# Patient Record
Sex: Male | Born: 1975 | Race: White | Hispanic: No | Marital: Married | Smoking: Never smoker
Health system: Southern US, Community
[De-identification: ages and names within clinical notes are randomized; demographics above are authoritative.]

## PROBLEM LIST (undated history)

## (undated) HISTORY — PX: TONSILLECTOMY: SUR1361

---

## 1998-12-15 ENCOUNTER — Emergency Department (HOSPITAL_COMMUNITY): Admission: EM | Admit: 1998-12-15 | Discharge: 1998-12-15 | Payer: Self-pay

## 1999-12-13 ENCOUNTER — Emergency Department (HOSPITAL_COMMUNITY): Admission: EM | Admit: 1999-12-13 | Discharge: 1999-12-13 | Payer: Self-pay | Admitting: Emergency Medicine

## 2000-05-04 ENCOUNTER — Emergency Department (HOSPITAL_COMMUNITY): Admission: EM | Admit: 2000-05-04 | Discharge: 2000-05-04 | Payer: Self-pay | Admitting: Internal Medicine

## 2000-12-26 ENCOUNTER — Emergency Department (HOSPITAL_COMMUNITY): Admission: EM | Admit: 2000-12-26 | Discharge: 2000-12-26 | Payer: Self-pay | Admitting: *Deleted

## 2007-12-09 ENCOUNTER — Emergency Department (HOSPITAL_COMMUNITY): Admission: EM | Admit: 2007-12-09 | Discharge: 2007-12-09 | Payer: Self-pay | Admitting: Emergency Medicine

## 2011-03-24 LAB — POCT I-STAT, CHEM 8
BUN: 11
Calcium, Ion: 1.13
Chloride: 108
Creatinine, Ser: 0.7
Glucose, Bld: 105 — ABNORMAL HIGH
HCT: 40
Hemoglobin: 13.6
Potassium: 4.4
Sodium: 140
TCO2: 24

## 2011-06-26 ENCOUNTER — Emergency Department (HOSPITAL_BASED_OUTPATIENT_CLINIC_OR_DEPARTMENT_OTHER)
Admission: EM | Admit: 2011-06-26 | Discharge: 2011-06-26 | Disposition: A | Discharge status: Home / Self Care | Attending: Emergency Medicine | Admitting: Emergency Medicine

## 2011-06-26 DIAGNOSIS — J069 Acute upper respiratory infection, unspecified: Secondary | ICD-10-CM

## 2011-06-26 NOTE — ED Notes (Signed)
Head,chest congestion x "few weeks"

## 2011-06-26 NOTE — ED Provider Notes (Signed)
History     CSN: [id number]  Arrival date & time 06/26/11  1105   First MD Initiated Contact with Patient 06/26/11 1134      Chief Complaint  Patient presents with  . URI   patient describes dry cough, nasal congestion, runny nose for the past few weeks. He's had no chest pain. No fever. No nausea or vomiting. He is here with for the family members that are sick with upper respiratory symptoms. Patient is in no distress.  (Consider location/radiation/quality/duration/timing/severity/associated sxs/prior treatment) HPI  History reviewed. No pertinent past medical history.  Past Surgical History  Procedure Date  . Tonsillectomy     No family history on file.  History  Substance Use Topics  . Smoking status: Never Smoker   . Smokeless tobacco: Not on file  . Alcohol Use: No      Review of Systems  All other systems reviewed and are negative.    Allergies  Review of patient's allergies indicates no known allergies.  Home Medications  No current outpatient prescriptions on file.  BP 128/58  Pulse 63  Temp(Src) 98.1 F (36.7 C) (Oral)  Resp 16  Ht 5\' 10"  (1.778 m)  Wt 195 lb (88.451 kg)  BMI 27.98 kg/m2  SpO2 98%  Physical Exam  Nursing note and vitals reviewed. Constitutional: He is oriented to person, place, and time. He appears well-developed and well-nourished.  HENT:  Head: Normocephalic and atraumatic.  Mouth/Throat: Oropharynx is clear and moist.  Eyes: Conjunctivae and EOM are normal. Pupils are equal, round, and reactive to light.  Neck: Neck supple.  Cardiovascular: Normal rate and regular rhythm.  Exam reveals no gallop and no friction rub.   No murmur heard. Pulmonary/Chest: Effort normal and breath sounds normal. No respiratory distress. He has no wheezes. He has no rales. He exhibits no tenderness.  Abdominal: Soft. Bowel sounds are normal. He exhibits no distension. There is no tenderness. There is no rebound and no guarding.    Musculoskeletal: Normal range of motion.  Neurological: He is alert and oriented to person, place, and time. No cranial nerve deficit. Coordination normal.  Skin: Skin is warm and dry. No rash noted.  Psychiatric: He has a normal mood and affect.    ED Course  Procedures (including critical care time)  Labs Reviewed - No data to display No results found.   No diagnosis found.    MDM  Pt is seen and examined;  Initial history and physical completed.  Will follow.          Clayton Jarmon A. Patrica Duel, MD 06/26/11 1147

## 2013-07-12 ENCOUNTER — Encounter (HOSPITAL_BASED_OUTPATIENT_CLINIC_OR_DEPARTMENT_OTHER): Payer: Self-pay | Admitting: Emergency Medicine

## 2013-07-12 ENCOUNTER — Emergency Department (HOSPITAL_BASED_OUTPATIENT_CLINIC_OR_DEPARTMENT_OTHER)
Admission: EM | Admit: 2013-07-12 | Discharge: 2013-07-12 | Disposition: A | Discharge status: Home / Self Care | Attending: Emergency Medicine | Admitting: Emergency Medicine

## 2013-07-12 DIAGNOSIS — X500XXA Overexertion from strenuous movement or load, initial encounter: Secondary | ICD-10-CM | POA: Insufficient documentation

## 2013-07-12 DIAGNOSIS — M7750 Other enthesopathy of unspecified foot: Secondary | ICD-10-CM

## 2013-07-12 DIAGNOSIS — M659 Synovitis and tenosynovitis, unspecified: Secondary | ICD-10-CM | POA: Insufficient documentation

## 2013-07-12 DIAGNOSIS — M65979 Unspecified synovitis and tenosynovitis, unspecified ankle and foot: Secondary | ICD-10-CM | POA: Insufficient documentation

## 2013-07-12 DIAGNOSIS — Y939 Activity, unspecified: Secondary | ICD-10-CM | POA: Insufficient documentation

## 2013-07-12 DIAGNOSIS — Y92838 Other recreation area as the place of occurrence of the external cause: Secondary | ICD-10-CM

## 2013-07-12 DIAGNOSIS — Y9239 Other specified sports and athletic area as the place of occurrence of the external cause: Secondary | ICD-10-CM | POA: Insufficient documentation

## 2013-07-12 MED ORDER — HYDROCODONE-ACETAMINOPHEN 5-325 MG PO TABS: 2.0000 | ORAL_TABLET | ORAL | Status: DC | PRN

## 2013-07-12 NOTE — Discharge Instructions (Signed)
Tendinitis °Tendinitis is swelling and inflammation of the tendons. Tendons are band-like tissues that connect muscle to bone. Tendinitis commonly occurs in the:  °· Shoulders (rotator cuff). °· Heels (Achilles tendon). °· Elbows (triceps tendon). °CAUSES °Tendinitis is usually caused by overusing the tendon, muscles, and joints involved. When the tissue surrounding a tendon (synovium) becomes inflamed, it is called tenosynovitis. Tendinitis commonly develops in people whose jobs require repetitive motions. °SYMPTOMS °· Pain. °· Tenderness. °· Mild swelling. °DIAGNOSIS °Tendinitis is usually diagnosed by physical exam. Your caregiver may also order X-rays or other imaging tests. °TREATMENT °Your caregiver may recommend certain medicines or exercises for your treatment. °HOME CARE INSTRUCTIONS  °· Use a sling or splint for as long as directed by your caregiver until the pain decreases. °· Put ice on the injured area. °· Put ice in a plastic bag. °· Place a towel between your skin and the bag. °· Leave the ice on for 15-20 minutes, 03-04 times a day. °· Avoid using the limb while the tendon is painful. Perform gentle range of motion exercises only as directed by your caregiver. Stop exercises if pain or discomfort increase, unless directed otherwise by your caregiver. °· Only take over-the-counter or prescription medicines for pain, discomfort, or fever as directed by your caregiver. °SEEK MEDICAL CARE IF:  °· Your pain and swelling increase. °· You develop new, unexplained symptoms, especially increased numbness in the hands. °MAKE SURE YOU:  °· Understand these instructions. °· Will watch your condition. °· Will get help right away if you are not doing well or get worse. °Document Released: 06/10/2000 Document Revised: 09/05/2011 Document Reviewed: 08/30/2010 °[employer]® Patient Information ©2014 [employer]. ° °

## 2013-07-12 NOTE — ED Provider Notes (Signed)
CSN: [id number]     Arrival date & time 07/12/13  1726 History   This chart was scribed for Nelia Shiobert L Derry Kassel, MD by Blanchard KelchNicole Curnes, ED Scribe. The patient was seen in room MH04/MH04. Patient's care was started at 8:18 PM.    Chief Complaint  Patient presents with  . Leg Pain    Patient is a 38 y.o. male presenting with leg pain. The history is provided by the patient. No language interpreter was used.  Leg Pain   HPI Comments: Samuel Davies is a 38 y.o. male who presents to the Emergency Department complaining of constant, moderate right ankle pain that began yesterday. He has a decreased ROM of the ankle secondary to pain. He also reports associated warmth to the area. He has been taking Ibuprofen, 800 mg every eight hours, starting yesterday afternoon. He denies any acute injury that he knows of other than walking on it much more this week due to starting a new job. He also twisted the ankle today at the gym, but this was after the original onset of the pain. He has a history of plantar fasciatus in his right foot. He denies any other pertinent history, including arthritis or gout.    History reviewed. No pertinent past medical history. Past Surgical History  Procedure Laterality Date  . Tonsillectomy     No family history on file. History  Substance Use Topics  . Smoking status: Never Smoker   . Smokeless tobacco: Not on file  . Alcohol Use: No    Review of Systems A complete 10 system review of systems was obtained and all systems are negative except as noted in the HPI and PMH.    Allergies  Review of patient's allergies indicates no known allergies.  Home Medications   Current Outpatient Rx  Name  Route  Sig  Dispense  Refill  . HYDROcodone-acetaminophen (NORCO/VICODIN) 5-325 MG per tablet   Oral   Take 2 tablets by mouth every 4 (four) hours as needed.   6 tablet   0     Triage Vitals: BP 127/76  Pulse 66  Temp(Src) 98.3 F (36.8 C) (Oral)  Resp 20  Ht 5\' 10"   (1.778 m)  Wt 240 lb (108.863 kg)  BMI 34.44 kg/m2  SpO2 100%  Physical Exam  Nursing note and vitals reviewed. Constitutional: He is oriented to person, place, and time. He appears well-developed and well-nourished. No distress.  HENT:  Head: Normocephalic and atraumatic.  Eyes: Pupils are equal, round, and reactive to light.  Neck: Normal range of motion.  Cardiovascular: Normal rate and intact distal pulses.   Pulmonary/Chest: No respiratory distress.  Abdominal: Normal appearance. He exhibits no distension.  Musculoskeletal:       Right ankle: He exhibits decreased range of motion. He exhibits no swelling and normal pulse. Achilles tendon normal.       Feet:  No medial or lateral boney tenderness  Neurological: He is alert and oriented to person, place, and time. No cranial nerve deficit.  Skin: Skin is warm and dry. No rash noted.  Psychiatric: He has a normal mood and affect. His behavior is normal.    ED Course  Procedures (including critical care time)  DIAGNOSTIC STUDIES: Oxygen Saturation is 100% on room air, normal by my interpretation.    COORDINATION OF CARE: 8:22 PM -Clinical suspicion of tenosynovitis. Recommend OTC NSAIDs and avoiding overuse. Patient verbalizes understanding and agrees with treatment plan.  Labs Review Labs Reviewed - No  data to display Imaging Review No results found.  EKG Interpretation   None       MDM   1. Tendonitis of ankle    I personally performed the services described in this documentation, which was scribed in my presence. The recorded information has been reviewed and considered.   Nelia Shi, MD 07/18/13 2009

## 2013-07-12 NOTE — ED Notes (Addendum)
Pain and swelling to his right lower leg. Second injury when he twisted his right ankle at the gym. He would like to have a rash on his arms and legs checked while he is here.

## 2013-07-12 NOTE — ED Notes (Signed)
Pt reports right ankle pain since yesterday, unsure exact injury. Redness noted to ankle. Twisted at gym today. No obvious swelilng/deformity.

## 2016-06-29 DIAGNOSIS — J45909 Unspecified asthma, uncomplicated: Secondary | ICD-10-CM | POA: Insufficient documentation

## 2017-01-17 ENCOUNTER — Encounter: Payer: Self-pay | Admitting: Podiatry

## 2017-01-17 ENCOUNTER — Ambulatory Visit (INDEPENDENT_AMBULATORY_CARE_PROVIDER_SITE_OTHER): Payer: BLUE CROSS/BLUE SHIELD | Admitting: Podiatry

## 2017-01-17 DIAGNOSIS — L600 Ingrowing nail: Secondary | ICD-10-CM

## 2017-01-17 DIAGNOSIS — B351 Tinea unguium: Secondary | ICD-10-CM

## 2017-01-17 MED ORDER — CEPHALEXIN 500 MG PO CAPS: 500.0000 mg | ORAL_CAPSULE | Freq: Three times a day (TID) | ORAL | 0 refills | Status: DC

## 2017-01-17 NOTE — Progress Notes (Signed)
Subjective:    Patient ID: Samuel Davies, male   DOB: 41 y.o.   MRN: [id number]   HPI Mr. Samuel Davies Presents the office today for concerns of a chronic ingrown toenail to left big toe. He states that this has been ongoing for the last 5 years but over the last couple months has been worsening. He states that he will try cut the nail out himself and is better for a couple months and stretch come back. He states that his been red and swollen around the toenail for over one month and has been looking somewhat better but does continue. Denies any drainage or pus. He did previously have the nail removed approximately 20 years ago on the corners. Has no other concerns today.   Review of Systems  All other systems reviewed and are negative.       Objective:  Physical Exam General: AAO x3, NAD  Dermatological: There is incurvation present both the medial and lateral aspects of the left hallux toenail with localized edema and erythema along the nail borders. There is no ascending saline as there is no drainage or pus expressed. There is tenderness on the nail borders. The left hallux toenail does appear to have yellow to brown discoloration an mildly thick. No open lesions or pre-ulcerative lesions of the foot otherwise.  Vascular: Dorsalis Pedis artery and Posterior Tibial artery pedal pulses are 2/4 bilateral with immedate capillary fill time.  There is no pain with calf compression, swelling, warmth, erythema.   Neruologic: Grossly intact via light touch bilateral. Vibratory intact via tuning fork bilateral. Protective threshold with Semmes Wienstein monofilament intact to all pedal sites bilateral.   Musculoskeletal: No gross boney pedal deformities bilateral. No pain, crepitus, or limitation noted with foot and ankle range of motion bilateral. Muscular strength 5/5 in all groups tested bilateral.  Gait: Unassisted, Nonantalgic.      Assessment:     41 year old male symptomatic ingrown toenail  left hallux medial/lateral nail borders    Plan:     -Treatment options discussed including all alternatives, risks, and complications -Etiology of symptoms were discussed -At this time, the patient is requesting partial nail removal with chemical matricectomy to the symptomatic portion of the nail. Risks and complications were discussed with the patient for which they understand and  verbally consent to the procedure. Under sterile conditions a total of 3 mL of a mixture of 2% lidocaine plain and 0.5% Marcaine plain was infiltrated in a hallux block fashion. Once anesthetized, the skin was prepped in sterile fashion. A tourniquet was then applied. Next the medial and lateral aspect of hallux nail border was then sharply excised making sure to remove the entire offending nail border. Once the nails were ensured to be removed area was debrided and the underlying skin was intact. There is no purulence identified in the procedure. Next phenol was then applied under standard conditions and copiously irrigated. Silvadene was applied. A dry sterile dressing was applied. After application of the dressing the tourniquet was removed and there is found to be an immediate capillary refill time to the digit. The patient tolerated the procedure well any complications. Post procedure instructions were discussed the patient for which he verbally understood. Follow-up in one week for nail check or sooner if any problems are to arise. Discussed signs/symptoms of infection and directed to call the office immediately should any occur or go directly to the emergency room. In the meantime, encouraged to call the office with any questions,  concerns, changes symptoms. -Keflex -Nail sent for culture  Ovid Curd, DPM

## 2017-01-17 NOTE — Progress Notes (Signed)
   Subjective:    Patient ID: Samuel Davies, male    DOB: May 31, 1976, 41 y.o.   MRN: [id number]  HPI  I have an ingrown toenail on my left big toe and has been going on for about 1 month and is sore and tender and does drain and had it removed about 20 years ago and has come back and digs at it   Review of Systems  All other systems reviewed and are negative.      Objective:   Physical Exam        Assessment & Plan:

## 2017-01-17 NOTE — Addendum Note (Signed)
Addended by: Hadley PenOX, Elpidio Thielen R on: 01/17/2017 03:04 PM   Modules accepted: Orders

## 2017-01-17 NOTE — Patient Instructions (Signed)

## 2017-01-18 ENCOUNTER — Telehealth: Payer: Self-pay | Admitting: Podiatry

## 2017-01-18 ENCOUNTER — Telehealth: Payer: Self-pay | Admitting: *Deleted

## 2017-01-18 MED ORDER — DOXYCYCLINE HYCLATE 100 MG PO CAPS: 100.0000 mg | ORAL_CAPSULE | Freq: Two times a day (BID) | ORAL | 0 refills | Status: DC

## 2017-01-18 NOTE — Telephone Encounter (Signed)
Yes I'm calling because I had a procedure done yesterday and was not thinking when the antibiotic was prescribed that I'm allergic to it. I took it home to compare it and it is one I'm allergic to. I did not take it but I wanted to know if the doctor could send a new Rx to the [facility].

## 2017-01-18 NOTE — Telephone Encounter (Signed)
Thank you for changing that.

## 2017-01-18 NOTE — Telephone Encounter (Signed)
This patient called to tell me that he is allergic to cephalexin.  He was prescribed cephalexin by Dr. Ardelle AntonWagoner .  He called to say this medicine put him in the hospital for a week.   He says he was diagnosed with an allergic reaction.  I told him to stop taking cephalexin and I will call him another medication [facility]   Helane GuntherGregory Lynsey Ange DPM

## 2017-01-18 NOTE — Telephone Encounter (Signed)
Dr. Stacie AcresMayer states pt called states had ingrown [place] [facility] [place], was prescribed cephalexin. Pt states he forgot to tell Dr. Ardelle AntonWagoner he had a reaction to the Cephalexin, that hospitalized him for a week. Dr. Stacie AcresMayer ordered Doxycycline 100mg  #20 one capsule bid. I informed pt of the orders.

## 2017-01-24 ENCOUNTER — Ambulatory Visit (INDEPENDENT_AMBULATORY_CARE_PROVIDER_SITE_OTHER): Payer: BLUE CROSS/BLUE SHIELD | Admitting: Podiatry

## 2017-01-24 ENCOUNTER — Encounter: Payer: Self-pay | Admitting: Podiatry

## 2017-01-24 DIAGNOSIS — Z9889 Other specified postprocedural states: Secondary | ICD-10-CM

## 2017-01-24 DIAGNOSIS — L600 Ingrowing nail: Secondary | ICD-10-CM

## 2017-01-24 NOTE — Patient Instructions (Signed)

## 2017-01-24 NOTE — Progress Notes (Signed)
Subjective: Samuel Davies is a 41 y.o.  male returns to office today for follow up evaluation after having left Hallux medial and lateral permanent nail avulsion performed. Patient has been soaking using epsom salts and applying topical antibiotic covered with bandaid daily. Denies any pain, drainage, swelling. He is able to wear a regular shoe without any issues. Patient denies fevers, chills, nausea, vomiting. Denies any calf pain, chest pain, SOB.   Objective:  Vitals: Reviewed  General: Well developed, nourished, in no acute distress, alert and oriented x3   Dermatology: Skin is warm, dry and supple bilateral. Medial and latearl hallux nail border appears to be clean, dry, with mild granular tissue and surrounding scab. There is pain surrounding erythema but there is no drainage or pus there is no ascending cellulitis. No warmth. The remaining nails appear unremarkable at this time. There are no other lesions or other signs of infection present.  Neurovascular status: Intact. No lower extremity swelling; No pain with calf compression bilateral.  Musculoskeletal: Decreased tenderness to palpation of the medial and lateral hallux nail folds. Muscular strength within normal limits bilateral.   Assesement and Plan: S/p partial nail avulsion, doing well.   -Continue soaking in epsom salts twice a day followed by antibiotic ointment and a band-aid. Can leave uncovered at night. Continue this until completely healed.  -If the area has not healed in 2 weeks, call the office for follow-up appointment, or sooner if any problems arise.  -Monitor for any signs/symptoms of infection. Call the office immediately if any occur or go directly to the emergency room. Call with any questions/concerns.  Samuel Davies, DPM

## 2017-01-31 NOTE — Telephone Encounter (Addendum)
-----   Message from Vivi BarrackMatthew R Wagoner, DPM sent at 01/31/2017  1:39 PM EDT ----- Negative for fungus- please let him know. Thank you. Unable to leave a message the mail box is full.

## 2017-02-17 ENCOUNTER — Telehealth: Payer: Self-pay | Admitting: *Deleted

## 2017-02-17 NOTE — Telephone Encounter (Addendum)
-----   Message from Vivi Barrack, DPM sent at 01/31/2017  1:39 PM EDT ----- Negative for fungus- please let him know. Thank you.02/17/2017-Left message informing pt the specimen results were negative and no treatment was necessary.

## 2017-10-08 ENCOUNTER — Encounter: Payer: Self-pay | Admitting: Podiatry

## 2017-10-09 ENCOUNTER — Telehealth: Payer: Self-pay | Admitting: Podiatry

## 2017-10-09 NOTE — Telephone Encounter (Signed)
Left vm for pt to call office to schedule appt °

## 2017-10-10 ENCOUNTER — Encounter: Payer: Self-pay | Admitting: Podiatry

## 2017-10-10 ENCOUNTER — Ambulatory Visit (INDEPENDENT_AMBULATORY_CARE_PROVIDER_SITE_OTHER): Payer: BLUE CROSS/BLUE SHIELD | Admitting: Podiatry

## 2017-10-10 DIAGNOSIS — L600 Ingrowing nail: Secondary | ICD-10-CM

## 2017-10-10 MED ORDER — SULFAMETHOXAZOLE-TRIMETHOPRIM 800-160 MG PO TABS: 1.0000 | ORAL_TABLET | Freq: Two times a day (BID) | ORAL | 0 refills | Status: DC

## 2017-10-10 NOTE — Patient Instructions (Signed)

## 2017-10-11 NOTE — Progress Notes (Signed)
Subjective: Samuel Samuel Davies presents the office today for concerns of ingrown toenails to the right big toe on both nail corners.  The areas are painful with pressure in shoes.  Denies any redness or drainage or any pus.  He has no recent treatment.  The left foot is doing well and had no issues. Denies any systemic complaints such as fevers, chills, nausea, vomiting. No acute changes since last appointment, and no other complaints at this time.   Objective: AAO x3, NAD DP/PT pulses palpable bilaterally, CRT less than 3 seconds Incurvation present on both the medial aspect of the right hallux toenail with tenderness palpation.  There is localized edema and faint erythema but there is no drainage or pus.  No ascending cellulitis.  No malodor.  There is palpation of the nail corners of the right side.  Left side is doing well on there is no recurrence of ingrown toenail and no pain or signs of infection. No open lesions or pre-ulcerative lesions.  No pain with calf compression, swelling, warmth, erythema  Assessment: Right hallux ingrown toenail  Plan: -All treatment options discussed with the patient including all alternatives, risks, complications.  -At this time, the patient is requesting partial nail removal with chemical matricectomy to the symptomatic portion of the nail. Risks and complications were discussed with the patient for which they understand and written consent was obtained. Under sterile conditions a total of 3 mL of a mixture of 2% lidocaine plain and 0.5% Marcaine plain was infiltrated in a hallux block fashion. Once anesthetized, the skin was prepped in sterile fashion. A tourniquet was then applied. Next the medial and lateral aspect of hallux nail border was then sharply excised making sure to remove the entire offending nail border. Once the nails were ensured to be removed area was debrided and the underlying skin was intact. There is no purulence identified in the procedure. Next phenol  was then applied under standard conditions and copiously irrigated. Silvadene was applied. A dry sterile dressing was applied. After application of the dressing the tourniquet was removed and there is found to be an immediate capillary refill time to the digit. The patient tolerated the procedure well any complications. Post procedure instructions were discussed the patient for which he verbally understood. Follow-up in one week for nail check or sooner if any problems are to arise. Discussed signs/symptoms of infection and directed to call the office immediately should any occur or go directly to the emergency room. In the meantime, encouraged to call the office with any questions, concerns, changes symptoms. -Bactrim -Patient encouraged to call the office with any questions, concerns, change in symptoms.   Samuel BarrackMatthew R Samuel Davies Samuel Davies DPM

## 2017-10-27 ENCOUNTER — Ambulatory Visit: Payer: BLUE CROSS/BLUE SHIELD | Admitting: Podiatry

## 2018-02-20 DIAGNOSIS — G4709 Other insomnia: Secondary | ICD-10-CM | POA: Insufficient documentation

## 2018-02-20 DIAGNOSIS — F419 Anxiety disorder, unspecified: Secondary | ICD-10-CM | POA: Insufficient documentation

## 2018-04-16 DIAGNOSIS — J0120 Acute ethmoidal sinusitis, unspecified: Secondary | ICD-10-CM | POA: Insufficient documentation

## 2018-09-11 ENCOUNTER — Other Ambulatory Visit: Payer: Self-pay

## 2018-09-11 ENCOUNTER — Ambulatory Visit: Payer: BLUE CROSS/BLUE SHIELD | Admitting: Podiatry

## 2018-09-11 DIAGNOSIS — L600 Ingrowing nail: Secondary | ICD-10-CM | POA: Diagnosis not present

## 2018-09-11 MED ORDER — DOXYCYCLINE HYCLATE 100 MG PO TABS: 100.0000 mg | ORAL_TABLET | Freq: Two times a day (BID) | ORAL | 0 refills | Status: DC

## 2018-09-11 NOTE — Patient Instructions (Signed)

## 2018-09-12 NOTE — Progress Notes (Signed)
Subjective: 43 year old male presents the office today for concerns of recurrent ingrown toenail to left big toe, medial aspect which is been on for the last month.  He states that walking makes it worse.  Is been red and swollen on the corner but denies any drainage or pus at this time. Denies any systemic complaints such as fevers, chills, nausea, vomiting. No acute changes since last appointment, and no other complaints at this time.   Objective: AAO x3, NAD DP/PT pulses palpable bilaterally, CRT less than 3 seconds Incurvation present to the medial aspect the left hallux toenail with tenderness palpation.  There is localized edema and erythema but there is no drainage or pus there is no ascending cellulitis.  There is no fluctuation crepitation any malodor.  No open lesions or pre-ulcerative lesions.  No pain with calf compression, swelling, warmth, erythema  Assessment: Left medial hallux ingrown toenail  Plan: -All treatment options discussed with the patient including all alternatives, risks, complications.  -At this time, the patient is requesting partial nail removal with chemical matricectomy to the symptomatic portion of the nail. Risks and complications were discussed with the patient for which they understand and written consent was obtained. Under sterile conditions a total of 3 mL of a mixture of 2% lidocaine plain and 0.5% Marcaine plain was infiltrated in a hallux block fashion. Once anesthetized, the skin was prepped in sterile fashion. A tourniquet was then applied. Next the medial aspect of hallux nail border was then sharply excised making sure to remove the entire offending nail border. Once the nails were ensured to be removed area was debrided and the underlying skin was intact. There is no purulence identified in the procedure. Next phenol was then applied under standard conditions and copiously irrigated. Silvadene was applied. A dry sterile dressing was applied. After  application of the dressing the tourniquet was removed and there is found to be an immediate capillary refill time to the digit. The patient tolerated the procedure well any complications. Post procedure instructions were discussed the patient for which he verbally understood. Follow-up in one week for nail check or sooner if any problems are to arise. Discussed signs/symptoms of infection and directed to call the office immediately should any occur or go directly to the emergency room. In the meantime, encouraged to call the office with any questions, concerns, changes symptoms. -Doxycycline  -Patient encouraged to call the office with any questions, concerns, change in symptoms.   Vivi Barrack DPM

## 2018-09-18 ENCOUNTER — Other Ambulatory Visit: Payer: BLUE CROSS/BLUE SHIELD

## 2020-02-18 DIAGNOSIS — U071 COVID-19: Secondary | ICD-10-CM | POA: Insufficient documentation

## 2020-03-23 ENCOUNTER — Other Ambulatory Visit: Payer: Self-pay

## 2020-03-23 ENCOUNTER — Ambulatory Visit (INDEPENDENT_AMBULATORY_CARE_PROVIDER_SITE_OTHER): Payer: BC Managed Care – PPO | Admitting: Podiatry

## 2020-03-23 DIAGNOSIS — L600 Ingrowing nail: Secondary | ICD-10-CM | POA: Diagnosis not present

## 2020-03-23 MED ORDER — SULFAMETHOXAZOLE-TRIMETHOPRIM 800-160 MG PO TABS
1.0000 | ORAL_TABLET | Freq: Two times a day (BID) | ORAL | 0 refills | Status: DC
Start: 2020-03-23 — End: 2020-03-23

## 2020-03-23 MED ORDER — SULFAMETHOXAZOLE-TRIMETHOPRIM 800-160 MG PO TABS
1.0000 | ORAL_TABLET | Freq: Two times a day (BID) | ORAL | 0 refills | Status: DC
Start: 2020-03-23 — End: 2023-07-06

## 2020-03-23 NOTE — Patient Instructions (Signed)

## 2020-03-24 NOTE — Progress Notes (Signed)
Subjective: 44 year old male presents the office today for concerns of recurrent ingrown toenail left big toe but the medial side worse on the lateral.  He states the area is tender with pressure.  Denies any drainage or pus.  He is try to trim it himself without any success.  No other concerns.Denies any systemic complaints such as fevers, chills, nausea, vomiting. No acute changes since last appointment, and no other complaints at this time.   Objective: AAO x3, NAD DP/PT pulses palpable bilaterally, CRT less than 3 seconds Incurvation present to both medial lateral aspects of the left hallux toenail with localized edema and faint erythema.  No drainage or pus or ascending cellulitis.  The erythema is likely more from inflammation as opposed to infection.  There is no open lesions. No pain with calf compression, swelling, warmth, erythema  Assessment: 44 year old male ingrown toenail left hallux toenail  Plan: -All treatment options discussed with the patient including all alternatives, risks, complications.  -At this time, the patient is requesting partial nail removal with chemical matricectomy to the symptomatic portion of the nail. Risks and complications were discussed with the patient for which they understand and written consent was obtained. Under sterile conditions a total of 3 mL of a mixture of 2% lidocaine plain and 0.5% Marcaine plain was infiltrated in a hallux block fashion. Once anesthetized, the skin was prepped in sterile fashion. A tourniquet was then applied. Next the medial/lateral aspect of hallux nail border was then sharply excised making sure to remove the entire offending nail border. Once the nails were ensured to be removed area was debrided and the underlying skin was intact. There is no purulence identified in the procedure. Next phenol was then applied under standard conditions and copiously irrigated. Silvadene was applied. A dry sterile dressing was applied. After  application of the dressing the tourniquet was removed and there is found to be an immediate capillary refill time to the digit. The patient tolerated the procedure well any complications. Post procedure instructions were discussed the patient for which he verbally understood. Follow-up in one week for nail check or sooner if any problems are to arise. Discussed signs/symptoms of infection and directed to call the office immediately should any occur or go directly to the emergency room. In the meantime, encouraged to call the office with any questions, concerns, changes symptoms. -Patient encouraged to call the office with any questions, concerns, change in symptoms.   Vivi Barrack DPM

## 2020-04-13 ENCOUNTER — Ambulatory Visit: Payer: BC Managed Care – PPO | Admitting: Podiatry

## 2020-10-06 ENCOUNTER — Other Ambulatory Visit: Payer: Self-pay | Admitting: Physical Medicine & Rehabilitation

## 2020-10-06 DIAGNOSIS — M545 Low back pain, unspecified: Secondary | ICD-10-CM

## 2020-10-09 ENCOUNTER — Other Ambulatory Visit

## 2020-10-13 ENCOUNTER — Ambulatory Visit
Admission: RE | Admit: 2020-10-13 | Discharge: 2020-10-13 | Disposition: A | Discharge status: Home / Self Care | Source: Ambulatory Visit | Attending: Physical Medicine & Rehabilitation | Admitting: Physical Medicine & Rehabilitation

## 2020-10-13 ENCOUNTER — Other Ambulatory Visit: Payer: Self-pay

## 2020-10-13 DIAGNOSIS — M545 Low back pain, unspecified: Secondary | ICD-10-CM

## 2020-10-18 ENCOUNTER — Other Ambulatory Visit

## 2020-11-24 ENCOUNTER — Other Ambulatory Visit: Payer: Self-pay | Admitting: Sports Medicine

## 2020-11-24 ENCOUNTER — Ambulatory Visit (INDEPENDENT_AMBULATORY_CARE_PROVIDER_SITE_OTHER): Payer: BC Managed Care – PPO | Admitting: Sports Medicine

## 2020-11-24 ENCOUNTER — Other Ambulatory Visit: Payer: Self-pay

## 2020-11-24 ENCOUNTER — Ambulatory Visit (INDEPENDENT_AMBULATORY_CARE_PROVIDER_SITE_OTHER): Payer: BC Managed Care – PPO

## 2020-11-24 ENCOUNTER — Encounter: Payer: Self-pay | Admitting: Sports Medicine

## 2020-11-24 DIAGNOSIS — M79673 Pain in unspecified foot: Secondary | ICD-10-CM | POA: Diagnosis not present

## 2020-11-24 DIAGNOSIS — M722 Plantar fascial fibromatosis: Secondary | ICD-10-CM | POA: Diagnosis not present

## 2020-11-24 DIAGNOSIS — M7730 Calcaneal spur, unspecified foot: Secondary | ICD-10-CM | POA: Diagnosis not present

## 2020-11-24 DIAGNOSIS — M2141 Flat foot [pes planus] (acquired), right foot: Secondary | ICD-10-CM | POA: Diagnosis not present

## 2020-11-24 DIAGNOSIS — M2142 Flat foot [pes planus] (acquired), left foot: Secondary | ICD-10-CM

## 2020-11-24 DIAGNOSIS — G8929 Other chronic pain: Secondary | ICD-10-CM

## 2020-11-24 MED ORDER — PREDNISONE 10 MG (21) PO TBPK: ORAL_TABLET | ORAL | 0 refills | Status: DC

## 2020-11-24 MED ORDER — MELOXICAM 15 MG PO TABS: 15.0000 mg | ORAL_TABLET | Freq: Every day | ORAL | 0 refills | Status: DC

## 2020-11-24 NOTE — Progress Notes (Signed)
Subjective: Samuel Davies is a 45 y.o. male patient presents to office with complaint of moderate heel pain on the left as well as pain on the right but the left hurts worse with some numbness states that he has had pain for years recently got worse with increased activity of walking and standing on concrete floors of 12 to 14 hours pain is 5 out of 10 has tried stretching over-the-counter topical creams or rubs but still has pain.  Patient reports he is got new insoles that helped a little bit.  Denies any other pedal complaints.   Patient Active Problem List   Diagnosis Date Noted  . Ingrown toenail 01/17/2017  . Mild asthma 06/29/2016    Current Outpatient Medications on File Prior to Visit  Medication Sig Dispense Refill  . ADDERALL XR 20 MG 24 hr capsule Take 20 mg by mouth every morning.    Marland Kitchen albuterol (VENTOLIN HFA) 108 (90 Base) MCG/ACT inhaler 1-2 inhalations every 4-6 hours as needed for wheezing. Dispense spacer as needed.    . ARIPiprazole (ABILIFY) 5 MG tablet Take 1 tablet by mouth daily.    . busPIRone (BUSPAR) 5 MG tablet Take 1 tablet by mouth 3 (three) times daily.    . cephALEXin (KEFLEX) 500 MG capsule Take 1 capsule (500 mg total) by mouth 3 (three) times daily. 21 capsule 0  . cetirizine (ZYRTEC) 10 MG tablet Take by mouth.    . chlorhexidine (PERIDEX) 0.12 % solution SMARTSIG:By Mouth    . doxycycline (VIBRA-TABS) 100 MG tablet Take 1 tablet (100 mg total) by mouth 2 (two) times daily. 20 tablet 0  . FLUoxetine (PROZAC) 20 MG capsule Take 1 capsule by mouth daily.    . Fluticasone-Salmeterol (ADVAIR) 100-50 MCG/DOSE AEPB Inhale into the lungs.    Marland Kitchen HYDROcodone-acetaminophen (NORCO/VICODIN) 5-325 MG per tablet Take 2 tablets by mouth every 4 (four) hours as needed. 6 tablet 0  . sulfamethoxazole-trimethoprim (BACTRIM DS) 800-160 MG tablet Take 1 tablet by mouth 2 (two) times daily. 21 tablet 0   No current facility-administered medications on file prior to visit.     Allergies  Allergen Reactions  . Cephalexin     Pt states spent 1 week in hospital after taking.    Objective: Physical Exam General: The patient is alert and oriented x3 in no acute distress.  Dermatology: Skin is warm, dry and supple bilateral lower extremities. Nails 1-10 are normal. There is no erythema, edema, no eccymosis, no open lesions present. Integument is otherwise unremarkable.  Vascular: Dorsalis Pedis pulse and Posterior Tibial pulse are 2/4 bilateral. Capillary fill time is immediate to all digits.  Neurological: Grossly intact to light touch  bilateral.  Musculoskeletal: Tenderness to palpation at the medial calcaneal tubercale and through the insertion of the plantar fascia on the left>right foot. No pain with compression of calcaneus bilateral. No pain with tuning fork to calcaneus bilateral. No pain with calf compression bilateral. There is decreased Ankle joint range of motion bilateral. All other joints range of motion within normal limits bilateral.  Pes planus foot type.  Strength 5/5 in all groups bilateral.   Gait: Unassisted  Xray, Right/Left foot:  Normal osseous mineralization. Joint spaces preserved except midtarsal where there is breech supportive of pes planus. No fracture/dislocation/boney destruction. Calcaneal spur present with mild thickening of plantar fascia. No other soft tissue abnormalities or radiopaque foreign bodies.   Assessment and Plan: Problem List Items Addressed This Visit   None   Visit Diagnoses  Chronic heel pain, unspecified laterality    -  Primary   Relevant Medications   gabapentin (NEURONTIN) 300 MG capsule   ibuprofen (ADVIL) 600 MG tablet   predniSONE (STERAPRED UNI-PAK 21 TAB) 10 MG (21) TBPK tablet   meloxicam (MOBIC) 15 MG tablet   Plantar fasciitis, bilateral       Calcaneal spur, unspecified laterality       Pes planus of both feet           -Complete examination performed.  -Xrays reviewed -Discussed  with patient in detail the condition of plantar fasciitis, how this occurs and general treatment options. Explained both conservative and surgical treatments.  -Rx meloxicam to start after prednisone dose pack is completed -Recommended good supportive shoes and advised use of OTC insert with heel lifts as dispensed. Explained to patient that if these orthoses work well, we will continue with these. If these do not improve his condition and  pain, may consider custom molded orthoses. -Explained and dispensed to patient daily stretching exercises. -Recommend patient to ice affected area 1-2x daily. -Patient to return to office in 4 weeks for follow up or sooner if problems or questions arise.  Asencion Islam, DPM

## 2020-12-22 ENCOUNTER — Ambulatory Visit (INDEPENDENT_AMBULATORY_CARE_PROVIDER_SITE_OTHER): Payer: BC Managed Care – PPO | Admitting: Sports Medicine

## 2020-12-22 ENCOUNTER — Other Ambulatory Visit: Payer: Self-pay

## 2020-12-22 ENCOUNTER — Encounter: Payer: Self-pay | Admitting: Sports Medicine

## 2020-12-22 DIAGNOSIS — M79673 Pain in unspecified foot: Secondary | ICD-10-CM

## 2020-12-22 DIAGNOSIS — M25519 Pain in unspecified shoulder: Secondary | ICD-10-CM | POA: Insufficient documentation

## 2020-12-22 DIAGNOSIS — L0292 Furuncle, unspecified: Secondary | ICD-10-CM | POA: Insufficient documentation

## 2020-12-22 DIAGNOSIS — H903 Sensorineural hearing loss, bilateral: Secondary | ICD-10-CM | POA: Insufficient documentation

## 2020-12-22 DIAGNOSIS — J069 Acute upper respiratory infection, unspecified: Secondary | ICD-10-CM | POA: Insufficient documentation

## 2020-12-22 DIAGNOSIS — N200 Calculus of kidney: Secondary | ICD-10-CM | POA: Insufficient documentation

## 2020-12-22 DIAGNOSIS — G8929 Other chronic pain: Secondary | ICD-10-CM

## 2020-12-22 DIAGNOSIS — Z23 Encounter for immunization: Secondary | ICD-10-CM | POA: Insufficient documentation

## 2020-12-22 DIAGNOSIS — M79609 Pain in unspecified limb: Secondary | ICD-10-CM | POA: Insufficient documentation

## 2020-12-22 DIAGNOSIS — IMO0002 Reserved for concepts with insufficient information to code with codable children: Secondary | ICD-10-CM | POA: Insufficient documentation

## 2020-12-22 DIAGNOSIS — Z638 Other specified problems related to primary support group: Secondary | ICD-10-CM | POA: Insufficient documentation

## 2020-12-22 DIAGNOSIS — J309 Allergic rhinitis, unspecified: Secondary | ICD-10-CM | POA: Insufficient documentation

## 2020-12-22 DIAGNOSIS — R42 Dizziness and giddiness: Secondary | ICD-10-CM | POA: Insufficient documentation

## 2020-12-22 DIAGNOSIS — E291 Testicular hypofunction: Secondary | ICD-10-CM | POA: Insufficient documentation

## 2020-12-22 DIAGNOSIS — G629 Polyneuropathy, unspecified: Secondary | ICD-10-CM | POA: Insufficient documentation

## 2020-12-22 DIAGNOSIS — B353 Tinea pedis: Secondary | ICD-10-CM | POA: Insufficient documentation

## 2020-12-22 DIAGNOSIS — M2141 Flat foot [pes planus] (acquired), right foot: Secondary | ICD-10-CM

## 2020-12-22 DIAGNOSIS — Z5689 Other problems related to employment: Secondary | ICD-10-CM | POA: Insufficient documentation

## 2020-12-22 DIAGNOSIS — M2142 Flat foot [pes planus] (acquired), left foot: Secondary | ICD-10-CM

## 2020-12-22 DIAGNOSIS — G479 Sleep disorder, unspecified: Secondary | ICD-10-CM | POA: Insufficient documentation

## 2020-12-22 DIAGNOSIS — R454 Irritability and anger: Secondary | ICD-10-CM | POA: Insufficient documentation

## 2020-12-22 DIAGNOSIS — F3131 Bipolar disorder, current episode depressed, mild: Secondary | ICD-10-CM | POA: Insufficient documentation

## 2020-12-22 DIAGNOSIS — M7730 Calcaneal spur, unspecified foot: Secondary | ICD-10-CM

## 2020-12-22 DIAGNOSIS — L739 Follicular disorder, unspecified: Secondary | ICD-10-CM | POA: Insufficient documentation

## 2020-12-22 DIAGNOSIS — E663 Overweight: Secondary | ICD-10-CM | POA: Insufficient documentation

## 2020-12-22 DIAGNOSIS — I10 Essential (primary) hypertension: Secondary | ICD-10-CM | POA: Insufficient documentation

## 2020-12-22 DIAGNOSIS — M7061 Trochanteric bursitis, right hip: Secondary | ICD-10-CM | POA: Insufficient documentation

## 2020-12-22 DIAGNOSIS — N201 Calculus of ureter: Secondary | ICD-10-CM | POA: Insufficient documentation

## 2020-12-22 DIAGNOSIS — F32A Depression, unspecified: Secondary | ICD-10-CM | POA: Insufficient documentation

## 2020-12-22 DIAGNOSIS — R197 Diarrhea, unspecified: Secondary | ICD-10-CM | POA: Insufficient documentation

## 2020-12-22 DIAGNOSIS — J039 Acute tonsillitis, unspecified: Secondary | ICD-10-CM | POA: Insufficient documentation

## 2020-12-22 DIAGNOSIS — E781 Pure hyperglyceridemia: Secondary | ICD-10-CM | POA: Insufficient documentation

## 2020-12-22 DIAGNOSIS — R5383 Other fatigue: Secondary | ICD-10-CM | POA: Insufficient documentation

## 2020-12-22 DIAGNOSIS — L0293 Carbuncle, unspecified: Secondary | ICD-10-CM | POA: Insufficient documentation

## 2020-12-22 DIAGNOSIS — F411 Generalized anxiety disorder: Secondary | ICD-10-CM | POA: Insufficient documentation

## 2020-12-22 DIAGNOSIS — R12 Heartburn: Secondary | ICD-10-CM | POA: Insufficient documentation

## 2020-12-22 DIAGNOSIS — M722 Plantar fascial fibromatosis: Secondary | ICD-10-CM

## 2020-12-22 DIAGNOSIS — Z09 Encounter for follow-up examination after completed treatment for conditions other than malignant neoplasm: Secondary | ICD-10-CM | POA: Insufficient documentation

## 2020-12-22 DIAGNOSIS — G4733 Obstructive sleep apnea (adult) (pediatric): Secondary | ICD-10-CM | POA: Insufficient documentation

## 2020-12-22 DIAGNOSIS — Z8659 Personal history of other mental and behavioral disorders: Secondary | ICD-10-CM | POA: Insufficient documentation

## 2020-12-22 DIAGNOSIS — Z0289 Encounter for other administrative examinations: Secondary | ICD-10-CM | POA: Insufficient documentation

## 2020-12-22 DIAGNOSIS — B348 Other viral infections of unspecified site: Secondary | ICD-10-CM | POA: Insufficient documentation

## 2020-12-22 DIAGNOSIS — H16109 Unspecified superficial keratitis, unspecified eye: Secondary | ICD-10-CM | POA: Insufficient documentation

## 2020-12-22 DIAGNOSIS — H9319 Tinnitus, unspecified ear: Secondary | ICD-10-CM | POA: Insufficient documentation

## 2020-12-22 DIAGNOSIS — Z6831 Body mass index (BMI) 31.0-31.9, adult: Secondary | ICD-10-CM | POA: Insufficient documentation

## 2020-12-22 DIAGNOSIS — R635 Abnormal weight gain: Secondary | ICD-10-CM | POA: Insufficient documentation

## 2020-12-22 DIAGNOSIS — L709 Acne, unspecified: Secondary | ICD-10-CM | POA: Insufficient documentation

## 2020-12-22 DIAGNOSIS — H8109 Meniere's disease, unspecified ear: Secondary | ICD-10-CM | POA: Insufficient documentation

## 2020-12-22 DIAGNOSIS — H527 Unspecified disorder of refraction: Secondary | ICD-10-CM | POA: Insufficient documentation

## 2020-12-22 DIAGNOSIS — Z111 Encounter for screening for respiratory tuberculosis: Secondary | ICD-10-CM | POA: Insufficient documentation

## 2020-12-22 DIAGNOSIS — M84376A Stress fracture, unspecified foot, initial encounter for fracture: Secondary | ICD-10-CM | POA: Insufficient documentation

## 2020-12-22 DIAGNOSIS — R351 Nocturia: Secondary | ICD-10-CM | POA: Insufficient documentation

## 2020-12-22 DIAGNOSIS — R5381 Other malaise: Secondary | ICD-10-CM | POA: Insufficient documentation

## 2020-12-22 MED ORDER — TRIAMCINOLONE ACETONIDE 10 MG/ML IJ SUSP: 10.0000 mg | Freq: Once | INTRAMUSCULAR | Status: AC

## 2020-12-22 NOTE — Progress Notes (Signed)
Subjective: Samuel Davies is a 45 y.o. male patient returns to office for follow up heel pain L>R.  Patient reports the meds did not help and the heel lift did not help cannot tell a difference reports that after he works 12+ hours he still has pain especially after work states that he is on vacation this week and the pain is less.  Patient reports that he will go back to work on Thursday.  Denies any other pedal complaints.   Patient Active Problem List   Diagnosis Date Noted   Ingrown toenail 01/17/2017   Mild asthma 06/29/2016    Current Outpatient Medications on File Prior to Visit  Medication Sig Dispense Refill   ADDERALL XR 20 MG 24 hr capsule Take 20 mg by mouth every morning.     albuterol (VENTOLIN HFA) 108 (90 Base) MCG/ACT inhaler 1-2 inhalations every 4-6 hours as needed for wheezing. Dispense spacer as needed.     ARIPiprazole (ABILIFY) 5 MG tablet Take 1 tablet by mouth daily.     busPIRone (BUSPAR) 5 MG tablet Take 1 tablet by mouth 3 (three) times daily.     cephALEXin (KEFLEX) 500 MG capsule Take 1 capsule (500 mg total) by mouth 3 (three) times daily. 21 capsule 0   cetirizine (ZYRTEC) 10 MG tablet Take by mouth.     chlorhexidine (PERIDEX) 0.12 % solution SMARTSIG:By Mouth     doxycycline (VIBRA-TABS) 100 MG tablet Take 1 tablet (100 mg total) by mouth 2 (two) times daily. 20 tablet 0   FLUoxetine (PROZAC) 20 MG capsule Take 1 capsule by mouth daily.     Fluticasone-Salmeterol (ADVAIR) 100-50 MCG/DOSE AEPB Inhale into the lungs.     gabapentin (NEURONTIN) 300 MG capsule Take by mouth.     HYDROcodone-acetaminophen (NORCO/VICODIN) 5-325 MG per tablet Take 2 tablets by mouth every 4 (four) hours as needed. 6 tablet 0   ibuprofen (ADVIL) 600 MG tablet Take by mouth.     meloxicam (MOBIC) 15 MG tablet Take 1 tablet (15 mg total) by mouth daily. 30 tablet 0   omeprazole (PRILOSEC) 20 MG capsule Take by mouth.     predniSONE (STERAPRED UNI-PAK 21 TAB) 10 MG (21) TBPK tablet  Take as directed 21 tablet 0   sulfamethoxazole-trimethoprim (BACTRIM DS) 800-160 MG tablet Take 1 tablet by mouth 2 (two) times daily. 21 tablet 0   No current facility-administered medications on file prior to visit.    Allergies  Allergen Reactions   Cephalexin     Pt states spent 1 week in hospital after taking.    Objective: Physical Exam General: The patient is alert and oriented x3 in no acute distress.  Dermatology: Skin is warm, dry and supple bilateral lower extremities. Nails 1-10 are normal. There is no erythema, edema, no eccymosis, no open lesions present. Integument is otherwise unremarkable.  Vascular: Dorsalis Pedis pulse and Posterior Tibial pulse are 2/4 bilateral. Capillary fill time is immediate to all digits.  Neurological: Grossly intact to light touch  bilateral.  Musculoskeletal: Tenderness to palpation at the medial calcaneal tubercale and through the insertion of the plantar fascia on the left>right foot. No pain with compression of calcaneus bilateral. No pain with tuning fork to calcaneus bilateral. No pain with calf compression bilateral. There is decreased Ankle joint range of motion bilateral. All other joints range of motion within normal limits bilateral.  Pes planus foot type.  Strength 5/5 in all groups bilateral.    Assessment and Plan: Problem List  Items Addressed This Visit   None Visit Diagnoses     Chronic heel pain, unspecified laterality    -  Primary   Plantar fasciitis, bilateral       Calcaneal spur, unspecified laterality       Pes planus of both feet            -Complete examination performed.  -Re-Discussed with patient in detail the condition of plantar fasciitis, how this occurs and general treatment options. Explained both conservative and surgical treatments.  -After oral consent and aseptic prep, injected a mixture containing 1 ml of 2%  plain lidocaine, 1 ml 0.5% plain marcaine, 0.5 ml of kenalog 10 and 0.5 ml of  dexamethasone phosphate into left and right heels at plantar fascial insertion without complication. Post-injection care discussed with patient.  -Dispensed 1 night splint to use as directed  -Continue with daily stretching exercises. -Continue with ice to affected area 1-2x daily. -Patient to return to office if no better after 1 month or sooner if problems or issues arise.  Asencion Islam, DPM

## 2021-02-02 ENCOUNTER — Other Ambulatory Visit: Payer: Self-pay | Admitting: Physical Medicine & Rehabilitation

## 2021-02-02 DIAGNOSIS — M542 Cervicalgia: Secondary | ICD-10-CM

## 2021-02-08 ENCOUNTER — Ambulatory Visit
Admission: RE | Admit: 2021-02-08 | Discharge: 2021-02-08 | Disposition: A | Discharge status: Home / Self Care | Payer: BC Managed Care – PPO | Source: Ambulatory Visit | Attending: Physical Medicine & Rehabilitation | Admitting: Physical Medicine & Rehabilitation

## 2021-02-08 ENCOUNTER — Other Ambulatory Visit: Payer: Self-pay

## 2021-02-08 DIAGNOSIS — M542 Cervicalgia: Secondary | ICD-10-CM

## 2021-03-12 DIAGNOSIS — M79676 Pain in unspecified toe(s): Secondary | ICD-10-CM

## 2021-09-15 ENCOUNTER — Encounter: Payer: Self-pay | Admitting: Neurology

## 2021-09-15 ENCOUNTER — Other Ambulatory Visit: Payer: Self-pay

## 2021-09-15 DIAGNOSIS — R202 Paresthesia of skin: Secondary | ICD-10-CM

## 2021-09-16 ENCOUNTER — Encounter: Payer: BC Managed Care – PPO | Admitting: Neurology

## 2022-01-17 IMAGING — MR MR LUMBAR SPINE W/O CM
4 of 6 series · 18 of 48 positions shown · non-contrast
Comparison: None.

CLINICAL DATA: Low back pain.  Burning and numbness right leg.

EXAM:
MRI LUMBAR SPINE WITHOUT CONTRAST
TECHNIQUE: Multiplanar, multisequence MR imaging of the lumbar spine was
performed. No intravenous contrast was administered.

[Series 6: T2 · sagittal · 4.0mm · 0.76mm/px · 5 of 17 slices shown (1 of 2)]
[im 1/17]
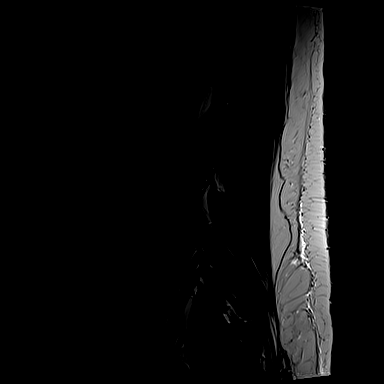
[im 5/17]
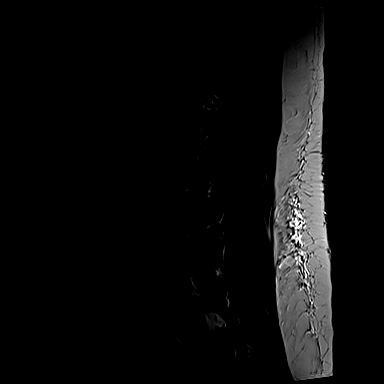
[im 9/17]
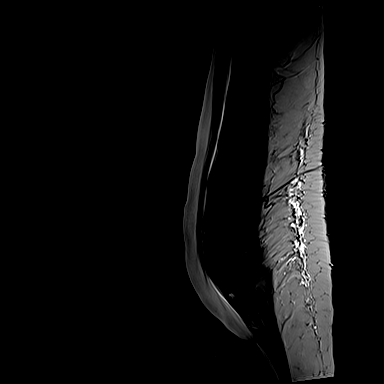
[im 13/17]
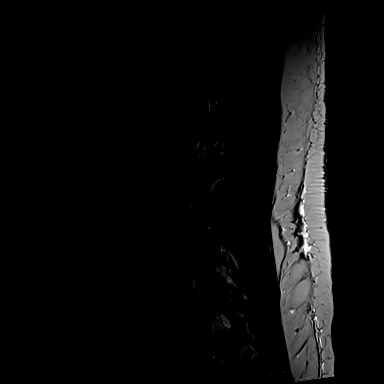
[im 17/17]
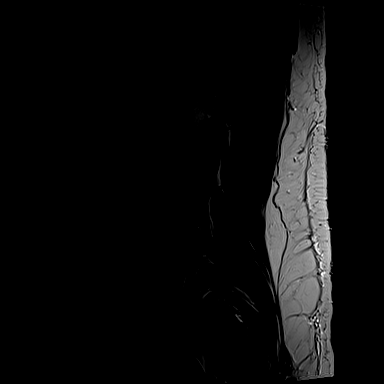

[Series 9: T1 · sagittal · 4.0mm · 0.73mm/px · 3 of 17 slices shown (1 of 2)]
[im 1/17]
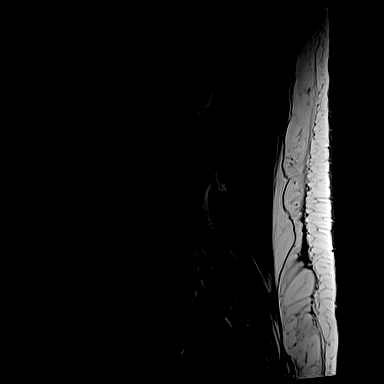
[im 9/17]
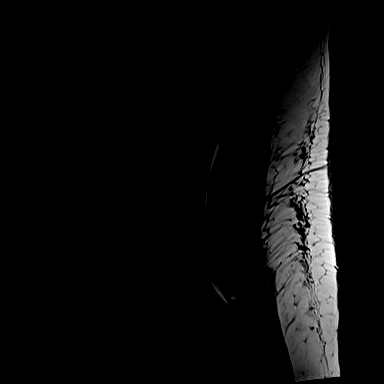
[im 17/17]
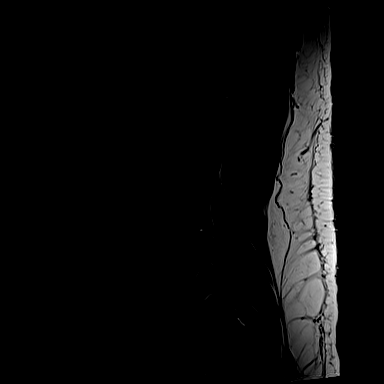

[Series 14: T2 · axial · 4.0mm · 0.28mm/px · z∈[-144,+58]mm · 7 of 42 slices shown (2 of 2)]
[im 1/42]
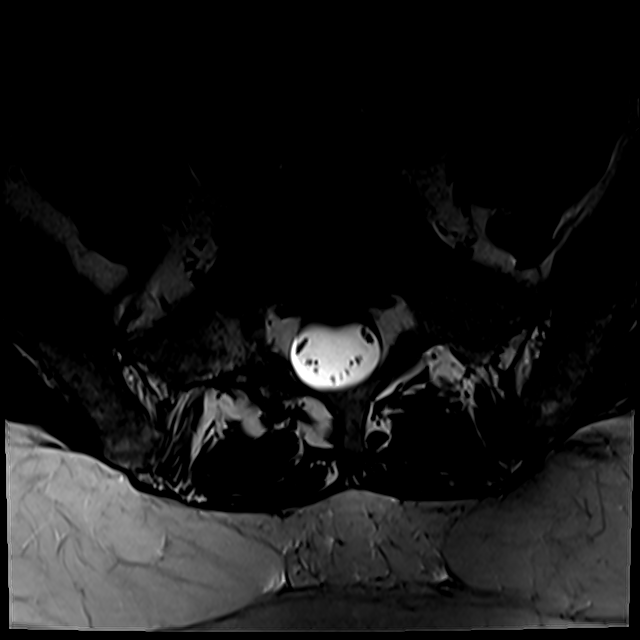
[im 5/42]
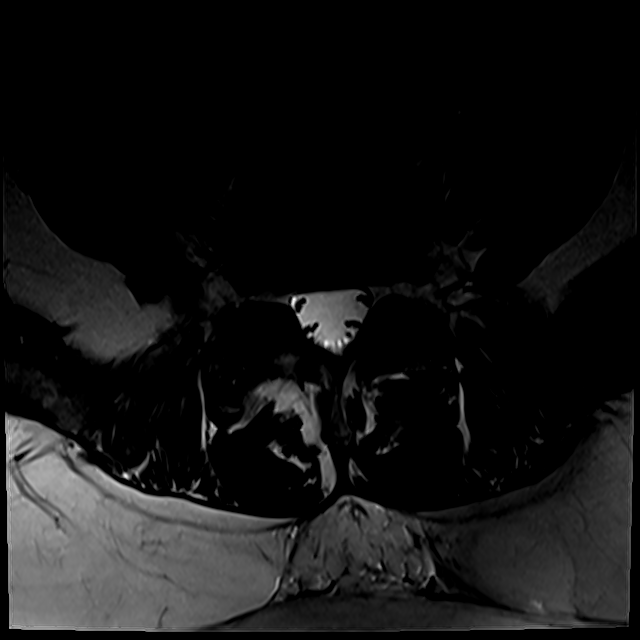
[im 9/42]
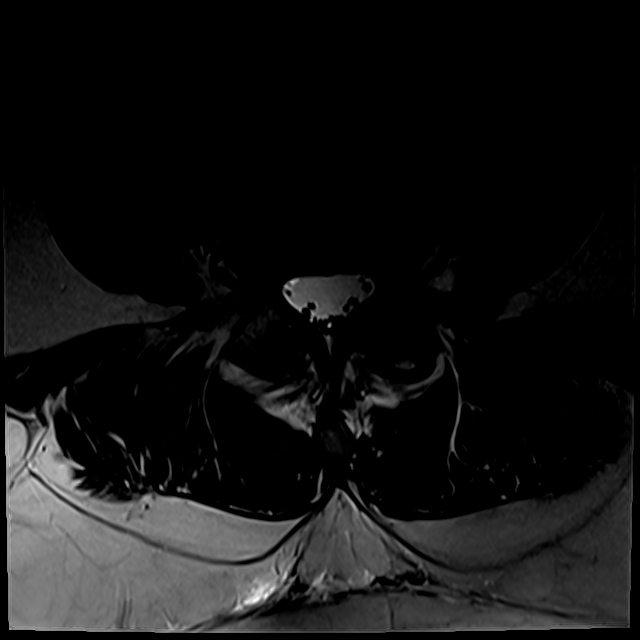
[im 13/42]
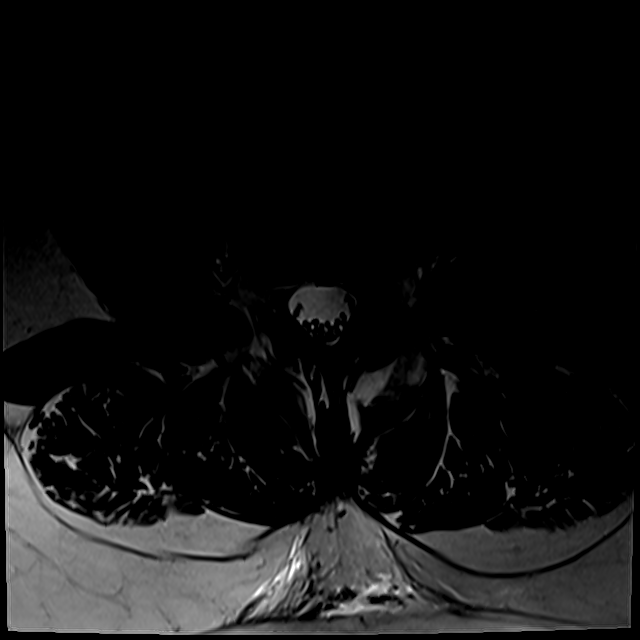
[im 17/42]
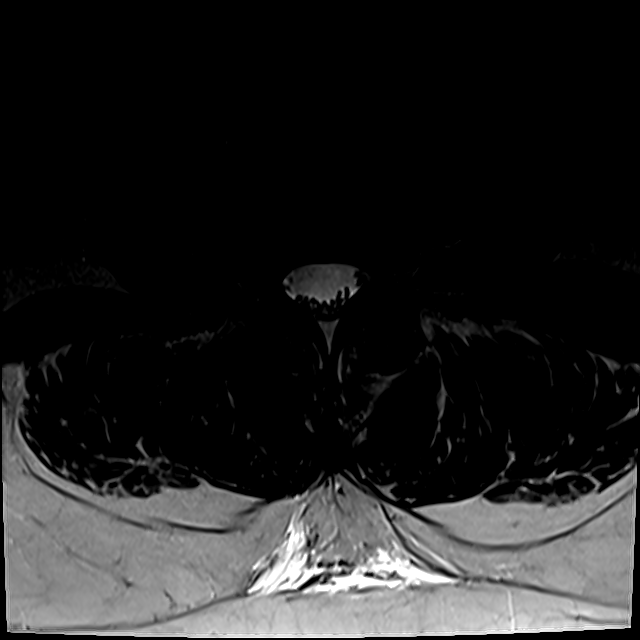
[im 21/42]
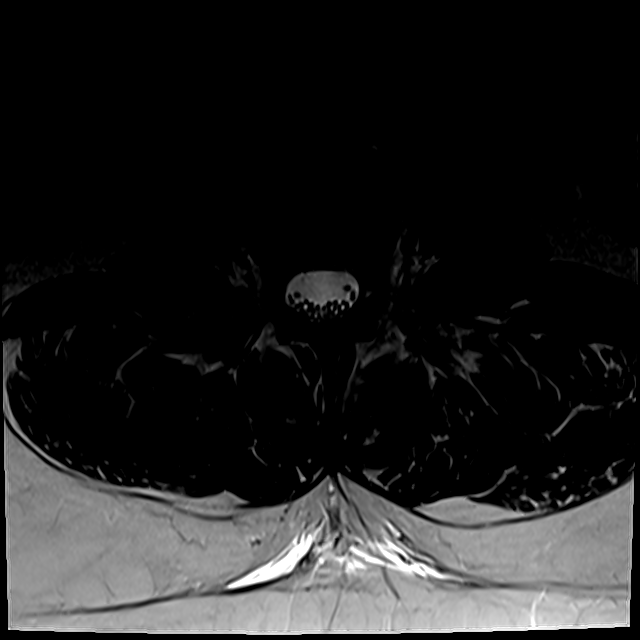
[im 37/42]
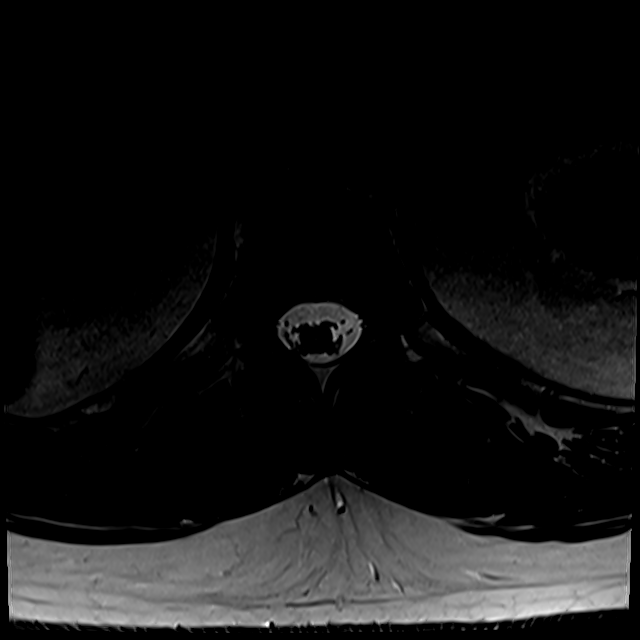

[Series 100: T1 · axial · 4.0mm · 0.28mm/px · z∈[-124,+58]mm · 3 of 42 slices shown (2 of 2)]
[im 5/42]
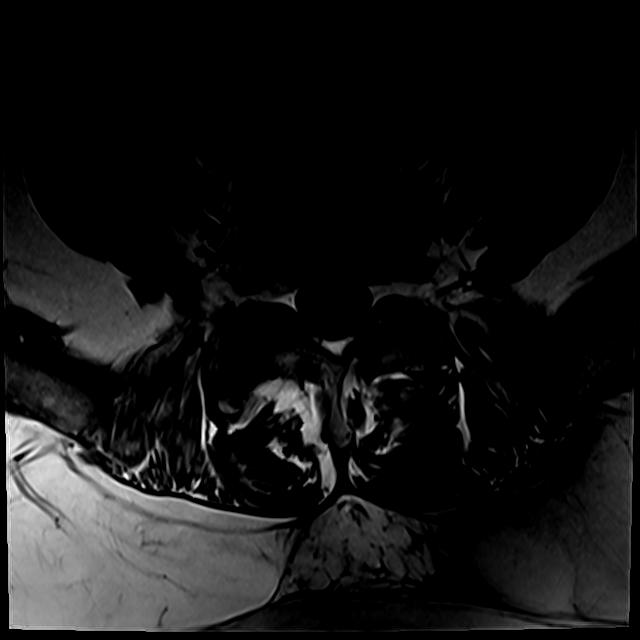
[im 21/42]
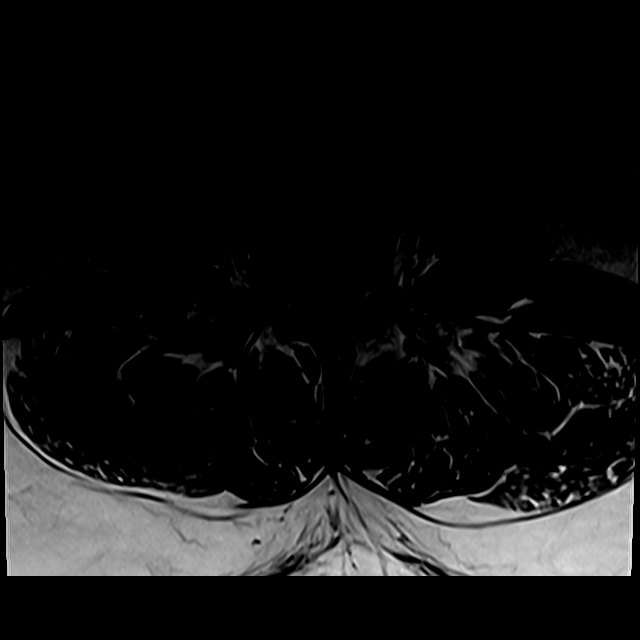
[im 37/42]
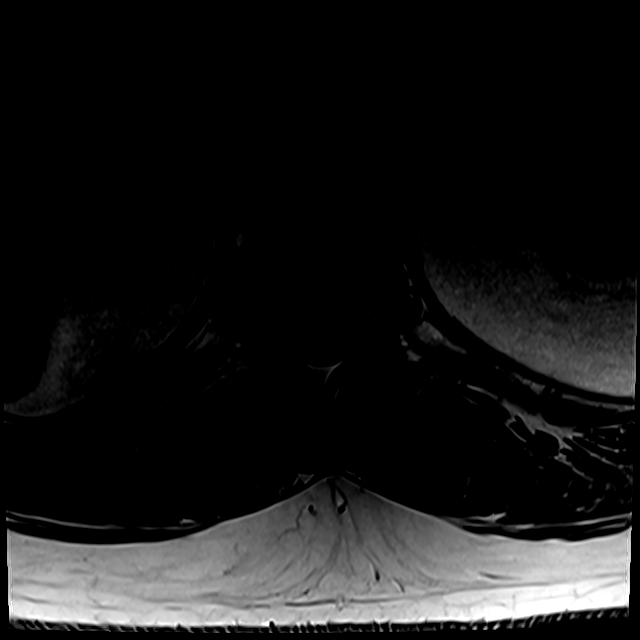

[18 of 48 positions shown; findings below may reference images not displayed]

FINDINGS: Segmentation:  Normal

Alignment:  Slight retrolisthesis L2-3, L3-4, L4-5

Vertebrae:  Normal bone marrow.  Negative for fracture or mass.

Conus medullaris and cauda equina: Conus extends to the L2 level.
Conus and cauda equina appear normal.

Paraspinal and other soft tissues: Negative for paraspinous mass or
adenopathy

Disc levels:

L1-2: Negative

L2-3: Mild disc and mild facet degeneration. Shallow left foraminal
disc protrusion with mild displacement of the left L2 nerve root. No
nerve root compression or stenosis

L3-4: Mild disc and mild facet degeneration. Negative for disc
protrusion or stenosis

L4-5: Small left paracentral disc protrusion extending into the left
foramen. Mild facet hypertrophy bilaterally. Mild subarticular and
foraminal stenosis on the left. Spinal canal normal in size.

L5-S1: Severe facet degeneration on the right contributing to mild
subarticular stenosis on the right. Mild facet degeneration on the
left. Mild disc degeneration
IMPRESSION: Shallow left foraminal disc protrusion L2-3 without nerve root
compression.

Small left paracentral disc protrusion L4-5 extending into the
foramen. Mild subarticular foraminal stenosis on the left

Severe facet degeneration on the right L5-S1 with mild subarticular
stenosis on the right.

## 2022-02-08 ENCOUNTER — Encounter: Payer: BC Managed Care – PPO | Admitting: Neurology

## 2022-10-19 DIAGNOSIS — I361 Nonrheumatic tricuspid (valve) insufficiency: Secondary | ICD-10-CM

## 2022-10-19 DIAGNOSIS — I480 Paroxysmal atrial fibrillation: Secondary | ICD-10-CM

## 2022-10-19 DIAGNOSIS — R079 Chest pain, unspecified: Secondary | ICD-10-CM

## 2022-10-19 DIAGNOSIS — E669 Obesity, unspecified: Secondary | ICD-10-CM

## 2022-10-19 DIAGNOSIS — G4733 Obstructive sleep apnea (adult) (pediatric): Secondary | ICD-10-CM

## 2023-03-12 ENCOUNTER — Emergency Department (HOSPITAL_COMMUNITY)
Admission: EM | Admit: 2023-03-12 | Discharge: 2023-03-12 | Disposition: A | Discharge status: Home / Self Care | Payer: No Typology Code available for payment source | Attending: Emergency Medicine | Admitting: Emergency Medicine

## 2023-03-12 ENCOUNTER — Other Ambulatory Visit: Payer: Self-pay

## 2023-03-12 ENCOUNTER — Encounter (HOSPITAL_COMMUNITY): Payer: Self-pay | Admitting: Emergency Medicine

## 2023-03-12 ENCOUNTER — Emergency Department (HOSPITAL_COMMUNITY): Payer: No Typology Code available for payment source

## 2023-03-12 DIAGNOSIS — D1724 Benign lipomatous neoplasm of skin and subcutaneous tissue of left leg: Secondary | ICD-10-CM | POA: Diagnosis not present

## 2023-03-12 DIAGNOSIS — I10 Essential (primary) hypertension: Secondary | ICD-10-CM | POA: Diagnosis not present

## 2023-03-12 DIAGNOSIS — M79605 Pain in left leg: Secondary | ICD-10-CM | POA: Diagnosis present

## 2023-03-12 LAB — CBC WITH DIFFERENTIAL/PLATELET
Abs Immature Granulocytes: 0.05 10*3/uL (ref 0.00–0.07)
Basophils Absolute: 0 10*3/uL (ref 0.0–0.1)
Basophils Relative: 0 %
Eosinophils Absolute: 0.4 10*3/uL (ref 0.0–0.5)
Eosinophils Relative: 4 %
HCT: 42.8 % (ref 39.0–52.0)
Hemoglobin: 13.7 g/dL (ref 13.0–17.0)
Immature Granulocytes: 1 %
Lymphocytes Relative: 31 %
Lymphs Abs: 3.3 10*3/uL (ref 0.7–4.0)
MCH: 30.1 pg (ref 26.0–34.0)
MCHC: 32 g/dL (ref 30.0–36.0)
MCV: 94.1 fL (ref 80.0–100.0)
Monocytes Absolute: 1.3 10*3/uL — ABNORMAL HIGH (ref 0.1–1.0)
Monocytes Relative: 12 %
Neutro Abs: 5.5 10*3/uL (ref 1.7–7.7)
Neutrophils Relative %: 52 %
Platelets: 227 10*3/uL (ref 150–400)
RBC: 4.55 MIL/uL (ref 4.22–5.81)
RDW: 12.6 % (ref 11.5–15.5)
WBC: 10.5 10*3/uL (ref 4.0–10.5)
nRBC: 0 % (ref 0.0–0.2)

## 2023-03-12 LAB — BRAIN NATRIURETIC PEPTIDE: B Natriuretic Peptide: 60.2 pg/mL (ref 0.0–100.0)

## 2023-03-12 LAB — I-STAT CREATININE, ED: Creatinine, Ser: 1.2 mg/dL (ref 0.61–1.24)

## 2023-03-12 MED ORDER — IOHEXOL 350 MG/ML SOLN
75.0000 mL | Freq: Once | INTRAVENOUS | Status: AC | PRN
  Administered 2023-03-12: 75 mL via INTRAVENOUS

## 2023-03-12 MED ORDER — KETOROLAC TROMETHAMINE 30 MG/ML IJ SOLN
30.0000 mg | Freq: Once | INTRAMUSCULAR | Status: AC
  Administered 2023-03-12: 30 mg via INTRAVENOUS
  Filled 2023-03-12: qty 1

## 2023-03-12 MED ORDER — HYDROMORPHONE HCL 1 MG/ML IJ SOLN
1.0000 mg | Freq: Once | INTRAMUSCULAR | Status: AC
  Administered 2023-03-12: 1 mg via INTRAVENOUS
  Filled 2023-03-12: qty 1
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
## 2023-03-12 NOTE — Discharge Instructions (Addendum)
You have a lipoma (fat collection) in your left thigh.  Your CT scan results are as below FINDINGS: 3.4 x 1.6 x 4.7 cm fat density lesion in the anterior left upper/mid thigh (series 4/image 154), compatible with an intramuscular lipoma.  Follow up with general surgery to discuss removal, especially since this may be causing the numbness you are experiencing.  I listed the contact information of our general surgery office below.  You may also go through the [place] if preferred.  You may use up to 800mg  ibuprofen every 8 hours as needed for pain.  Do not exceed 2.4g of ibuprofen per day.  You may take up to 1000mg  of tylenol every 6 hours as needed for pain.  Do not take more then 4g per day.  Return the ER if you develop weakness in your leg, worsening of your pain, any other new or concerning symptoms.

## 2023-03-12 NOTE — ED Triage Notes (Signed)
Pt reports mass to left upper thigh for a few months that is now aching. Pain to hip and down knee. Seen at [facility] and sent for xray that was negative. Then sent back to PCP for CT scan but PCP cannot see pt for weeks.

## 2023-06-15 ENCOUNTER — Encounter (HOSPITAL_COMMUNITY): Payer: Self-pay | Admitting: Emergency Medicine

## 2023-06-15 ENCOUNTER — Emergency Department (HOSPITAL_COMMUNITY): Payer: No Typology Code available for payment source

## 2023-06-15 ENCOUNTER — Other Ambulatory Visit: Payer: Self-pay

## 2023-06-15 ENCOUNTER — Emergency Department (HOSPITAL_COMMUNITY)
Admission: EM | Admit: 2023-06-15 | Discharge: 2023-06-16 | Disposition: A | Discharge status: Home / Self Care | Payer: No Typology Code available for payment source | Attending: Emergency Medicine | Admitting: Emergency Medicine

## 2023-06-15 DIAGNOSIS — M25552 Pain in left hip: Secondary | ICD-10-CM

## 2023-06-15 NOTE — ED Triage Notes (Signed)
Pt presents with left hip pain, worsening. Ongoing hx of back pain and sciatic trouble.

## 2023-06-15 NOTE — ED Provider Triage Note (Signed)
Emergency Medicine Provider Triage Evaluation Note  MASAYOSHI KARNITZ , a 47 y.o. male  was evaluated in triage.  Pt complains of left hip pain.  History of extensive back disc issues.  He has had multiple injections the past.  Waning of pain in the left hip whenever he stands.  No weakness or other red flag symptoms..  Review of Systems  Positive: Left hip pain Negative: Weakness  Physical Exam  BP (!) 141/62   Pulse 80   Temp 98.3 F (36.8 C) (Oral)   Resp 16   SpO2 98%  Gen:   Awake, no distress   Resp:  Normal effort  MSK:   Moves extremities without difficulty  Other:    Medical Decision Making  Medically screening exam initiated at 8:23 PM.  Appropriate orders placed.  Akshay Billa Senne was informed that the remainder of the evaluation will be completed by another provider, this initial triage assessment does not replace that evaluation, and the importance of remaining in the ED until their evaluation is complete.     Arthor Captain, PA-C 06/15/23 2028

## 2023-06-16 MED ORDER — LIDOCAINE 5 % EX PTCH
1.0000 | MEDICATED_PATCH | CUTANEOUS | Status: DC
  Administered 2023-06-16: 1 via TRANSDERMAL
  Filled 2023-06-16: qty 1

## 2023-06-16 MED ORDER — OXYCODONE HCL 5 MG PO TABS: 5.0000 mg | ORAL_TABLET | Freq: Four times a day (QID) | ORAL | 0 refills | Status: DC | PRN

## 2023-06-16 MED ORDER — KETOROLAC TROMETHAMINE 15 MG/ML IJ SOLN
15.0000 mg | Freq: Once | INTRAMUSCULAR | Status: AC
  Administered 2023-06-16: 15 mg via INTRAMUSCULAR
  Filled 2023-06-16: qty 1

## 2023-06-16 NOTE — ED Provider Notes (Signed)
[facility] EMERGENCY DEPARTMENT AT [facility] Provider Note   CSN: [id number] Arrival date & time: 06/15/23  1942     History  Chief Complaint  Patient presents with   Hip Pain    Samuel Davies is a 47 y.o. male.  47 year-old male with history of chronic back pain and chronic hip pain who presents emergency department with left hip pain.  Patient reports that over the past month has had pain in his left hip that feels like his hip needs to pop but never quite has.  No injuries that he knows of.  Denies fevers, new numbness or weakness of his legs, bowel or bladder incontinence.  Swims half a mile a day but no other repetitive use.  Has a history of chronic back pain and has had epidural injections, SI joint injections, nerve blocks, and nerve ablations by pain management.  Has also been trying lidocaine patches at home without improvement.  No back or hip surgeries.       Home Medications Prior to Admission medications   Medication Sig Start Date End Date Taking? Authorizing Provider  oxyCODONE (ROXICODONE) 5 MG immediate release tablet Take 1 tablet (5 mg total) by mouth every 6 (six) hours as needed. 06/16/23  Yes Rondel Baton, MD  acetaminophen (TYLENOL) 500 MG tablet Take by mouth. 12/02/20   [provider]  ADDERALL XR 20 MG 24 hr capsule Take 20 mg by mouth every morning. 11/26/19   [provider]  albuterol (VENTOLIN HFA) 108 (90 Base) MCG/ACT inhaler 1-2 inhalations every 4-6 hours as needed for wheezing. Dispense spacer as needed. 04/14/18 04/14/19  [provider]  ARIPiprazole (ABILIFY) 5 MG tablet Take 1 tablet by mouth daily. 04/23/19   [provider]  benzonatate (TESSALON) 100 MG capsule Take 1 capsule by mouth 3 (three) times daily. 12/02/20   [provider]  busPIRone (BUSPAR) 5 MG tablet Take 1 tablet by mouth 3 (three) times daily. 04/23/19   [provider]  cephALEXin (KEFLEX) 500 MG capsule  Take 1 capsule (500 mg total) by mouth 3 (three) times daily. 01/17/17   Vivi Barrack, DPM  cetirizine (ZYRTEC) 10 MG tablet Take by mouth.    [provider]  cetirizine (ZYRTEC) 10 MG tablet Take 1 tablet by mouth daily. 12/02/20   [provider]  chlorhexidine (PERIDEX) 0.12 % solution SMARTSIG:By Mouth 10/28/19   [provider]  dextromethorphan-guaiFENesin (ROBITUSSIN-DM) 10-100 MG/5ML liquid Take by mouth. 12/02/20   [provider]  doxycycline (VIBRA-TABS) 100 MG tablet Take 1 tablet (100 mg total) by mouth 2 (two) times daily. 09/11/18   Vivi Barrack, DPM  FLUoxetine (PROZAC) 20 MG capsule Take 1 capsule by mouth daily. 04/23/19   [provider]  Fluticasone-Salmeterol (ADVAIR) 100-50 MCG/DOSE AEPB Inhale into the lungs. 04/24/19   [provider]  gabapentin (NEURONTIN) 300 MG capsule Take by mouth. 10/30/20   [provider]  HYDROcodone-acetaminophen (NORCO/VICODIN) 5-325 MG per tablet Take 2 tablets by mouth every 4 (four) hours as needed. 07/12/13   Nelva Nay, MD  ibuprofen (ADVIL) 600 MG tablet Take by mouth. 11/10/20   [provider]  meloxicam (MOBIC) 15 MG tablet Take 1 tablet (15 mg total) by mouth daily. 11/24/20   Stover, Cassandria Anger, DPM  methylPREDNISolone (MEDROL DOSEPAK) 4 MG TBPK tablet Take by mouth. 12/02/20   [provider]  omeprazole (PRILOSEC) 20 MG capsule Take by mouth. 03/31/20   [provider]  predniSONE (STERAPRED UNI-PAK 21 TAB) 10 MG (21) TBPK tablet Take as directed 11/24/20   Asencion Islam, DPM  sulfamethoxazole-trimethoprim (BACTRIM DS) 800-160 MG tablet Take 1 tablet by mouth 2 (two) times daily. 03/23/20   Vivi Barrack, DPM      Allergies    Cephalexin    Review of Systems   Review of Systems  Physical Exam Updated Vital Signs BP 124/83 (BP Location: Right Arm)   Pulse 84   Temp 98.2 F (36.8 C) (Oral)   Resp 20   SpO2 100%  Physical  Exam Vitals and nursing note reviewed.  Constitutional:      General: He is not in acute distress.    Appearance: He is well-developed.  HENT:     Head: Normocephalic and atraumatic.     Right Ear: External ear normal.     Left Ear: External ear normal.     Nose: Nose normal.  Eyes:     Extraocular Movements: Extraocular movements intact.     Conjunctiva/sclera: Conjunctivae normal.     Pupils: Pupils are equal, round, and reactive to light.  Pulmonary:     Effort: Pulmonary effort is normal. No respiratory distress.  Musculoskeletal:     Cervical back: Normal range of motion and neck supple.     Right lower leg: No edema.     Left lower leg: No edema.     Comments: No spinal midline TTP in cervical, thoracic, or lumbar spine. No stepoffs noted.   Motor: Muscle bulk and tone are normal. Strength is 5/5 in hip flexion, knee flexion and extension, ankle dorsiflexion and plantar flexion bilaterally. Full strength of great toe dorsiflexion bilaterally.  Sensory: Intact sensation to light touch in L2 though S1 dermatomes bilaterally.   Skin:    General: Skin is warm and dry.  Neurological:     Mental Status: He is alert. Mental status is at baseline.  Psychiatric:        Mood and Affect: Mood normal.        Behavior: Behavior normal.     ED Results / Procedures / Treatments   Labs (all labs ordered are listed, but only abnormal results are displayed) Labs Reviewed - No data to display  EKG None  Radiology DG Hip Unilat With Pelvis 2-3 Views Left Result Date: 06/15/2023 CLINICAL DATA:  Hip pain. EXAM: DG HIP (WITH OR WITHOUT PELVIS) 2-3V LEFT COMPARISON:  None Available. FINDINGS: There is no evidence of hip fracture or dislocation. There is no evidence of arthropathy or other focal bone abnormality. IMPRESSION: Negative. Electronically Signed   By: Darliss Cheney M.D.   On: 06/15/2023 22:41    Procedures Procedures    Medications Ordered in ED Medications  ketorolac  (TORADOL) 15 MG/ML injection 15 mg (15 mg Intramuscular Given 06/16/23 1026)    ED Course/ Medical Decision Making/ A&P                                 Medical Decision Making Amount and/or Complexity of Data Reviewed Radiology: ordered.  Risk Prescription drug management.   SAQIB SUNDE is a 47 y.o. male with comorbidities that complicate the patient evaluation including chronic back pain and chronic hip pain who presents emergency department with left hip pain.   Initial Ddx:  Arthritis, sciatica, lumbar radiculopathy, septic joint, fracture  MDM/Course:  Patient presents to the emergency department with a month of  left hip pain.  No injuries.  Does have a extensive history of chronic back pain with sciatica.  Has been swimming but no other repetitive motions that I would suspect would cause an injury.  On exam still has full motion of the head and I do not see any signs of effusion.  Low suspicion for inflammatory arthritis.  He had an x-ray that did not show fracture.  Did not show effusion or even signs of significant arthritis.  Suspect that he may be having some sciatica causing his symptoms.  Sent home with a short course of pain medication and instructed to follow-up with his pain clinic.    This patient presents to the ED for concern of complaints listed in HPI, this involves an extensive number of treatment options, and is a complaint that carries with it a high risk of complications and morbidity. Disposition including potential need for admission considered.   Dispo: DC Home. Return precautions discussed including, but not limited to, those listed in the AVS. Allowed pt time to ask questions which were answered fully prior to dc.  Records reviewed Outpatient Clinic Notes I independently reviewed the following imaging with scope of interpretation limited to determining acute life threatening conditions related to emergency care: Extremity x-ray(s) and agree with the  radiologist interpretation with the following exceptions: none I have reviewed the patients home medications and made adjustments as needed  Portions of this note were generated with Dragon dictation software. Dictation errors may occur despite best attempts at proofreading.     Final Clinical Impression(s) / ED Diagnoses Final diagnoses:  Left hip pain    Rx / DC Orders ED Discharge Orders          Ordered    oxyCODONE (ROXICODONE) 5 MG immediate release tablet  Every 6 hours PRN        06/16/23 1007              Rondel Baton, MD 06/16/23 2059

## 2023-06-16 NOTE — ED Notes (Signed)
Pt requesting to speak to Consulting civil engineer.  He was requesting CD copy of xray.  After speaking to both Xray and CT RN found out that it was not possible.  RN went out to explain that it was not possible.  Pt began yelling and demanding that I bring the Provider out to him so that he can get his results.  RN explained that he would be able to see his results on his Mychart and also gave the medical records number however he did not want that. He was yelling so loud that [employer] came over to stand by RN.  Pt began filming the situation and RN left.

## 2023-06-16 NOTE — Discharge Instructions (Addendum)
You were seen for your hip pain in the emergency department.   At home, please take Tylenol and ibuprofen for pain.  You may take oxycodone for any breakthrough pain but do not take it before driving or drinking alcohol because it can make you drowsy.  Please also use over-the-counter lidocaine patches.  Pain.    Check your MyChart online for the results of any tests that had not resulted by the time you left the emergency department.   Follow-up with your primary doctor in 2-3 days regarding your visit.  Talk to them about a referral to orthopedics.  Follow-up with your pain management doctor soon as possible.  Return immediately to the emergency department if you experience any of the following: Worsening pain, bowel or bladder incontinence, or any other concerning symptoms.    Thank you for visiting our Emergency Department. It was a pleasure taking care of you today.

## 2023-06-16 NOTE — ED Notes (Signed)
Refused vitals 

## 2023-06-16 NOTE — ED Notes (Signed)
Pt refused vitals 

## 2023-07-03 DIAGNOSIS — I480 Paroxysmal atrial fibrillation: Secondary | ICD-10-CM | POA: Diagnosis not present

## 2023-07-03 DIAGNOSIS — I361 Nonrheumatic tricuspid (valve) insufficiency: Secondary | ICD-10-CM | POA: Diagnosis not present

## 2023-07-04 ENCOUNTER — Other Ambulatory Visit: Payer: Self-pay

## 2023-07-04 DIAGNOSIS — I480 Paroxysmal atrial fibrillation: Secondary | ICD-10-CM | POA: Diagnosis not present

## 2023-07-04 DIAGNOSIS — I4891 Unspecified atrial fibrillation: Secondary | ICD-10-CM

## 2023-07-06 ENCOUNTER — Ambulatory Visit: Payer: Medicare PPO | Attending: Cardiology | Admitting: Emergency Medicine

## 2023-07-06 VITALS — BP 112/84 | HR 84 | Ht 70.0 in | Wt 267.4 lb

## 2023-07-06 DIAGNOSIS — I4891 Unspecified atrial fibrillation: Secondary | ICD-10-CM

## 2023-07-06 NOTE — Progress Notes (Signed)
   Nurse Visit   Date of Encounter: 07/06/2023 ID: Redell DELENA Ester, DOB July 23, 1975, MRN [id number]  PCP:  [facility], [facility] Providers Cardiologist:  None      Visit Details   VS:  BP 112/84   Pulse 84   Ht 5' 10 (1.778 m)   Wt 267 lb 6.4 oz (121.3 kg)   BMI 38.37 kg/m  , BMI Body mass index is 38.37 kg/m.  Wt Readings from Last 3 Encounters:  07/06/23 267 lb 6.4 oz (121.3 kg)  03/12/23 238 lb 1.6 oz (108 kg)  07/12/13 240 lb (108.9 kg)     Reason for visit: ekg Performed today: Vitals, EKG  Changes (medications, testing, etc.) : none Length of Visit: 15 minutes    Medications Adjustments/Labs and Tests Ordered: Orders Placed This Encounter  Procedures   EKG 12-Lead   No orders of the defined types were placed in this encounter.    Signed, Ole SHAUNNA Hails, RN  07/06/2023 10:36 AM

## 2023-07-07 ENCOUNTER — Encounter: Payer: Self-pay | Admitting: Cardiology

## 2023-07-07 NOTE — Telephone Encounter (Signed)
 error

## 2023-07-24 ENCOUNTER — Ambulatory Visit: Payer: Medicare PPO | Attending: Cardiology | Admitting: Cardiology

## 2023-07-24 NOTE — Progress Notes (Deleted)
  Electrophysiology Office Note:   Date:  07/24/2023  ID:  Samuel Davies, DOB November 30, 1975, MRN [id number]  Primary Cardiologist: None Primary Heart Failure: None Electrophysiologist: None  {Click to update primary MD,subspecialty MD or APP then REFRESH:1}    History of Present Illness:   Samuel Davies is a 48 y.o. male with h/o atrial fibrillation, coronary artery disease, hyperlipidemia seen today for  for Electrophysiology evaluation of atrial fibrillation at the request of Sreedhar Madireddy.    He has had multiple hospitalizations for atrial fibrillation, most recently 07/03/2023 at [place].  He presented the emergency room and was noted to be in atrial fibrillation with heart rates of 147 bpm.  He converted to sinus rhythm without intervention.  Review of systems complete and found to be negative unless listed in HPI.   EP Information / Studies Reviewed:    {EKGtoday:28818}      ***  Risk Assessment/Calculations:   {Does this patient have ATRIAL FIBRILLATION?[phone]} No BP recorded.  {Refresh Note OR Click here to enter BP  :1}***        Physical Exam:   VS:  There were no vitals taken for this visit.   Wt Readings from Last 3 Encounters:  07/06/23 267 lb 6.4 oz (121.3 kg)  03/12/23 238 lb 1.6 oz (108 kg)  07/12/13 240 lb (108.9 kg)     GEN: Well nourished, well developed in no acute distress NECK: No JVD; No carotid bruits CARDIAC: {EPRHYTHM:28826}, no murmurs, rubs, gallops RESPIRATORY:  Clear to auscultation without rales, wheezing or rhonchi  ABDOMEN: Soft, non-tender, non-distended EXTREMITIES:  No edema; No deformity   ASSESSMENT AND PLAN:   ***  Follow up with {NFAOZ:30865} {EPFOLLOW HQ:46962}  Signed, Brie Eppard Jorja Loa, MD

## 2023-07-25 ENCOUNTER — Telehealth: Payer: Self-pay | Admitting: Cardiology

## 2023-07-31 NOTE — Telephone Encounter (Signed)
Talked to patient and he says that the [place] will be handling his cardiac needs at this time/no longer wishes to schedule/kbl 07/31/23

## 2023-09-29 ENCOUNTER — Ambulatory Visit: Payer: Medicare PPO

## 2023-12-26 DEATH — deceased
